# Patient Record
Sex: Male | Born: 1985 | Race: White | Hispanic: No | Marital: Married | Smoking: Former smoker
Health system: Southern US, Community
[De-identification: ages and names within clinical notes are randomized; demographics above are authoritative.]

## PROBLEM LIST (undated history)

## (undated) DIAGNOSIS — T7840XA Allergy, unspecified, initial encounter: Secondary | ICD-10-CM

## (undated) DIAGNOSIS — I10 Essential (primary) hypertension: Secondary | ICD-10-CM

## (undated) DIAGNOSIS — N200 Calculus of kidney: Secondary | ICD-10-CM

## (undated) HISTORY — DX: Allergy, unspecified, initial encounter: T78.40XA

## (undated) HISTORY — DX: Calculus of kidney: N20.0

## (undated) HISTORY — DX: Essential (primary) hypertension: I10

## (undated) HISTORY — PX: NO PAST SURGERIES: SHX2092

---

## 2017-02-27 ENCOUNTER — Ambulatory Visit (INDEPENDENT_AMBULATORY_CARE_PROVIDER_SITE_OTHER): Payer: PRIVATE HEALTH INSURANCE

## 2017-02-27 ENCOUNTER — Ambulatory Visit (INDEPENDENT_AMBULATORY_CARE_PROVIDER_SITE_OTHER): Payer: PRIVATE HEALTH INSURANCE | Admitting: Physician Assistant

## 2017-02-27 ENCOUNTER — Encounter: Payer: Self-pay | Admitting: Physician Assistant

## 2017-02-27 VITALS — BP 123/88 | HR 53 | Ht 68.0 in | Wt 191.0 lb

## 2017-02-27 DIAGNOSIS — R109 Unspecified abdominal pain: Secondary | ICD-10-CM

## 2017-02-27 DIAGNOSIS — N200 Calculus of kidney: Secondary | ICD-10-CM

## 2017-02-27 DIAGNOSIS — G8929 Other chronic pain: Secondary | ICD-10-CM | POA: Diagnosis not present

## 2017-02-27 DIAGNOSIS — Z87442 Personal history of urinary calculi: Secondary | ICD-10-CM | POA: Diagnosis not present

## 2017-02-27 DIAGNOSIS — Z7689 Persons encountering health services in other specified circumstances: Secondary | ICD-10-CM | POA: Diagnosis not present

## 2017-02-27 LAB — COMPREHENSIVE METABOLIC PANEL
ALT: 26 U/L (ref 9–46)
AST: 20 U/L (ref 10–40)
Albumin: 4.4 g/dL (ref 3.6–5.1)
Alkaline Phosphatase: 50 U/L (ref 40–115)
BUN: 13 mg/dL (ref 7–25)
CHLORIDE: 103 mmol/L (ref 98–110)
CO2: 22 mmol/L (ref 20–31)
CREATININE: 1.11 mg/dL (ref 0.60–1.35)
Calcium: 9.2 mg/dL (ref 8.6–10.3)
GLUCOSE: 82 mg/dL (ref 65–99)
POTASSIUM: 4.6 mmol/L (ref 3.5–5.3)
SODIUM: 138 mmol/L (ref 135–146)
TOTAL PROTEIN: 6.8 g/dL (ref 6.1–8.1)
Total Bilirubin: 0.4 mg/dL (ref 0.2–1.2)

## 2017-02-27 LAB — POCT URINALYSIS DIPSTICK
Bilirubin, UA: NEGATIVE
GLUCOSE UA: NEGATIVE
Ketones, UA: NEGATIVE
Leukocytes, UA: NEGATIVE
NITRITE UA: NEGATIVE
PH UA: 5.5 (ref 5.0–8.0)
Protein, UA: NEGATIVE
UROBILINOGEN UA: 0.2 U/dL

## 2017-02-27 LAB — CBC
HCT: 45.2 % (ref 38.5–50.0)
Hemoglobin: 15.1 g/dL (ref 13.2–17.1)
MCH: 29.5 pg (ref 27.0–33.0)
MCHC: 33.4 g/dL (ref 32.0–36.0)
MCV: 88.3 fL (ref 80.0–100.0)
MPV: 10.1 fL (ref 7.5–12.5)
PLATELETS: 273 10*3/uL (ref 140–400)
RBC: 5.12 MIL/uL (ref 4.20–5.80)
RDW: 13.1 % (ref 11.0–15.0)
WBC: 5.2 10*3/uL (ref 3.8–10.8)

## 2017-02-27 LAB — LIPID PANEL W/REFLEX DIRECT LDL
Cholesterol: 209 mg/dL — ABNORMAL HIGH (ref <200)
HDL: 53 mg/dL (ref >40)
LDL-CHOLESTEROL: 138 mg/dL — AB
NON-HDL CHOLESTEROL (CALC): 156 mg/dL — AB (ref <130)
Total CHOL/HDL Ratio: 3.9 Ratio (ref <5.0)
Triglycerides: 83 mg/dL (ref <150)

## 2017-02-27 NOTE — Progress Notes (Signed)
HPI:                                                                Mitchell Kemp is a 31 y.o. male who presents to [facility]: Primary Care Sports Medicine today to establish care   Current Concerns include kidney stones  Patient reports intermittent right-sided sharp flank pain for approx. 1 year. States he was told at an Emergency Room in [place] that he had a right-sided kidney stone approx. 1 year ago. States he was told he had a 6mm size stone. He was discharged on Flomax and states he only took it for a day or two. Denies hematuria, frequency, urgency, abdominal pain.  Health Maintenance Health Maintenance  Topic Date Due  . HIV Screening  04/28/1999  . TETANUS/TDAP  04/28/2003  . INFLUENZA VACCINE  06/04/2017    Past Medical History:  Diagnosis Date  . Allergy   . Hypertension   . Nephrolithiasis    Past Surgical History:  Procedure Laterality Date  . NO PAST SURGERIES     Social History  Substance Use Topics  . Smoking status: Former Smoker    Packs/day: 1.00    Years: 4.00    Types: Cigarettes    Quit date: 2007  . Smokeless tobacco: Current User    Types: Chew  . Alcohol use 0.6 - 1.2 oz/week    1 - 2 Cans of beer per week   family history is not on file.  ROS: negative except as noted in the HPI  Medications: No current outpatient prescriptions on file.   No current facility-administered medications for this visit.    No Known Allergies     Objective:  BP 123/88   Pulse (!) 53   Ht  (1.727 m)   Wt 191 lb (86.6 kg)   BMI 29.04 kg/m  Gen: well-groomed, cooperative, not ill-appearing, no distress Pulm: Normal work of breathing, normal phonation, clear to auscultation bilaterally CV: Normal rate, regular rhythm, s1 and s2 distinct, no murmurs, clicks or rubs, no carotid bruit GI: abdomen soft, nondistended, nontender, no masses, no CVA tenderness Neuro: alert and oriented x 3, EOM's intact, normal tone, no  tremor MSK: moving all extremities, normal gait and station, no peripheral edema Skin: warm and dry, no rashes or lesions on exposed skin Psych: normal affect, euthymic mood, normal speech and thought content  Depression screen [facility] 2/9 02/27/2017  Decreased Interest 0  Down, Depressed, Hopeless 0  PHQ - 2 Score 0     Results for orders placed or performed in visit on 02/27/17 (from the past 72 hour(s))  POCT Urinalysis Dipstick     Status: Abnormal   Collection Time: 02/27/17  9:48 AM  Result Value Ref Range   Color, UA light yellow    Clarity, UA clear    Glucose, UA negative    Bilirubin, UA negative    Ketones, UA negative    Spec Grav, UA <=1.005 (A) 1.010 - 1.025   Blood, UA trace    pH, UA 5.5 5.0 - 8.0   Protein, UA negative    Urobilinogen, UA 0.2 0.2 or 1.0 E.U./dL   Nitrite, UA negative    Leukocytes, UA Negative Negative  CBC  Status: None   Collection Time: 02/27/17  9:59 AM  Result Value Ref Range   WBC 5.2 3.8 - 10.8 K/uL   RBC 5.12 4.20 - 5.80 MIL/uL   Hemoglobin 15.1 13.2 - 17.1 g/dL   HCT 16.1 09.6 - 04.5 %   MCV 88.3 80.0 - 100.0 fL   MCH 29.5 27.0 - 33.0 pg   MCHC 33.4 32.0 - 36.0 g/dL   RDW 40.9 81.1 - 91.4 %   Platelets 273 140 - 400 K/uL   MPV 10.1 7.5 - 12.5 fL  Comprehensive metabolic panel     Status: None (Preliminary result)   Collection Time: 02/27/17  9:59 AM  Result Value Ref Range   Sodium  135 - 146 mmol/L   Potassium  3.5 - 5.3 mmol/L   Chloride  98 - 110 mmol/L   CO2  20 - 31 mmol/L   Glucose, Bld  65 - 99 mg/dL   BUN  7 - 25 mg/dL   Creat  7.82 - 9.56 mg/dL   Total Bilirubin  0.2 - 1.2 mg/dL   Alkaline Phosphatase  40 - 115 U/L   AST  10 - 40 U/L   ALT  9 - 46 U/L   Total Protein  6.1 - 8.1 g/dL   Albumin  3.6 - 5.1 g/dL   Calcium  8.6 - 21.3 mg/dL  Lipid Panel w/reflex Direct LDL     Status: None (Preliminary result)   Collection Time: 02/27/17  9:59 AM  Result Value Ref Range   Cholesterol  <200 mg/dL   Triglycerides   <086 mg/dL   HDL  mg/dL   Total CHOL/HDL Ratio  <5.0 Ratio   Non-HDL Cholesterol (Calc)  <130 mg/dL   LDL-Cholesterol  mg/dL   Dg Abd 1 View  Result Date: 02/27/2017 CLINICAL DATA:  Chronic right flank pain. EXAM: ABDOMEN - 1 VIEW COMPARISON:  No recent prior . FINDINGS: Soft tissue structures are unremarkable. No bowel distention. No free air. 8 mm calcific density noted over the right lower pelvis. Distal right ureteral stone cannot be excluded. No acute bony abnormality. IMPRESSION: 8 mm calcific density noted over the right lower renal pelvis. A distal right ureteral stone cannot be excluded. Electronically Signed   By: Maisie Fus  Register   On: 02/27/2017 10:48      Assessment and Plan: 31 y.o. male with  1. Encounter to establish care - CBC - Comprehensive metabolic panel - Lipid Panel w/reflex Direct LDL  2. Right flank pain, chronic - POCT Urinalysis Dipstick shows trace blood - DG Abd 1 View to assess for stone - I do not have access to patient's records from [place], but if he does have a 6mm stone present for 1 year it is unlikely to pass spontaneously and will need to refer him to Urology   Patient education and anticipatory guidance given Patient agrees with treatment plan Follow-up in 1 year for CPE or sooner as needed  Levonne Hubert PA-C

## 2017-02-27 NOTE — Patient Instructions (Signed)
I am going to refer you to Urology for follow-up of your flank pain/history of kidney stones  Go downstairs for X-ray and labs today    Dietary Guidelines to Help Prevent Kidney Stones Kidney stones are deposits of minerals and salts that form inside your kidneys. Your risk of developing kidney stones may be greater depending on your diet, your lifestyle, the medicines you take, and whether you have certain medical conditions. Most people can reduce their chances of developing kidney stones by following the instructions below. Depending on your overall health and the type of kidney stones you tend to develop, your dietitian may give you more specific instructions. What are tips for following this plan? Reading food labels   Choose foods with "no salt added" or "low-salt" labels. Limit your sodium intake to less than 1500 mg per day.  Choose foods with calcium for each meal and snack. Try to eat about 300 mg of calcium at each meal. Foods that contain 200-500 mg of calcium per serving include:  8 oz (237 ml) of milk, fortified nondairy milk, and fortified fruit juice.  8 oz (237 ml) of kefir, yogurt, and soy yogurt.  4 oz (118 ml) of tofu.  1 oz of cheese.  1 cup (300 g) of dried figs.  1 cup (91 g) of cooked broccoli.  1-3 oz can of sardines or mackerel.  Most people need 1000 to 1500 mg of calcium each day. Talk to your dietitian about how much calcium is recommended for you. Shopping   Buy plenty of fresh fruits and vegetables. Most people do not need to avoid fruits and vegetables, even if they contain nutrients that may contribute to kidney stones.  When shopping for convenience foods, choose:  Whole pieces of fruit.  Premade salads with dressing on the side.  Low-fat fruit and yogurt smoothies.  Avoid buying frozen meals or prepared deli foods.  Look for foods with live cultures, such as yogurt and kefir. Cooking   Do not add salt to food when cooking. Place a salt  shaker on the table and allow each person to add his or her own salt to taste.  Use vegetable protein, such as beans, textured vegetable protein (TVP), or tofu instead of meat in pasta, casseroles, and soups. Meal planning   Eat less salt, if told by your dietitian. To do this:  Avoid eating processed or premade food.  Avoid eating fast food.  Eat less animal protein, including cheese, meat, poultry, or fish, if told by your dietitian. To do this:  Limit the number of times you have meat, poultry, fish, or cheese each week. Eat a diet free of meat at least 2 days a week.  Eat only one serving each day of meat, poultry, fish, or seafood.  When you prepare animal protein, cut pieces into small portion sizes. For most meat and fish, one serving is about the size of one deck of cards.  Eat at least 5 servings of fresh fruits and vegetables each day. To do this:  Keep fruits and vegetables on hand for snacks.  Eat 1 piece of fruit or a handful of berries with breakfast.  Have a salad and fruit at lunch.  Have two kinds of vegetables at dinner.  Limit foods that are high in a substance called oxalate. These include:  Spinach.  Rhubarb.  Beets.  Potato chips and french fries.  Nuts.  If you regularly take a diuretic medicine, make sure to eat at least  1-2 fruits or vegetables high in potassium each day. These include:  Avocado.  Banana.  Orange, prune, carrot, or tomato juice.  Baked potato.  Cabbage.  Beans and split peas. General instructions   Drink enough fluid to keep your urine clear or pale yellow. This is the most important thing you can do.  Talk to your health care provider and dietitian about taking daily supplements. Depending on your health and the cause of your kidney stones, you may be advised:  Not to take supplements with vitamin C.  To take a calcium supplement.  To take a daily probiotic supplement.  To take other supplements such as  magnesium, fish oil, or vitamin B6.  Take all medicines and supplements as told by your health care provider.  Limit alcohol intake to no more than 1 drink a day for nonpregnant women and 2 drinks a day for men. One drink equals 12 oz of beer, 5 oz of wine, or 1 oz of hard liquor.  Lose weight if told by your health care provider. Work with your dietitian to find strategies and an eating plan that works best for you. What foods are not recommended? Limit your intake of the following foods, or as told by your dietitian. Talk to your dietitian about specific foods you should avoid based on the type of kidney stones and your overall health. Grains  Breads. Bagels. Rolls. Baked goods. Salted crackers. Cereal. Pasta. Vegetables  Spinach. Rhubarb. Beets. Canned vegetables. Rosita Fire. Olives. Meats and other protein foods  Nuts. Nut butters. Large portions of meat, poultry, or fish. Salted or cured meats. Deli meats. Hot dogs. Sausages. Dairy  Cheese. Beverages  Regular soft drinks. Regular vegetable juice. Seasonings and other foods  Seasoning blends with salt. Salad dressings. Canned soups. Soy sauce. Ketchup. Barbecue sauce. Canned pasta sauce. Casseroles. Pizza. Lasagna. Frozen meals. Potato chips. [place] fries. Summary  You can reduce your risk of kidney stones by making changes to your diet.  The most important thing you can do is drink enough fluid. You should drink enough fluid to keep your urine clear or pale yellow.  Ask your health care provider or dietitian how much protein from animal sources you should eat each day, and also how much salt and calcium you should have each day. This information is not intended to replace advice given to you by your health care provider. Make sure you discuss any questions you have with your health care provider. Document Released: 02/15/2011 Document Revised: 10/01/2016 Document Reviewed: 10/01/2016 Elsevier Interactive Patient Education  2017  [employer].

## 2018-06-09 IMAGING — DX DG ABDOMEN 1V
2 series · 2 of 2 positions shown · non-contrast
Comparison: No recent prior .

CLINICAL DATA: Chronic right flank pain.

EXAM:
ABDOMEN - 1 VIEW

[abdomen kub (1 of 2)]
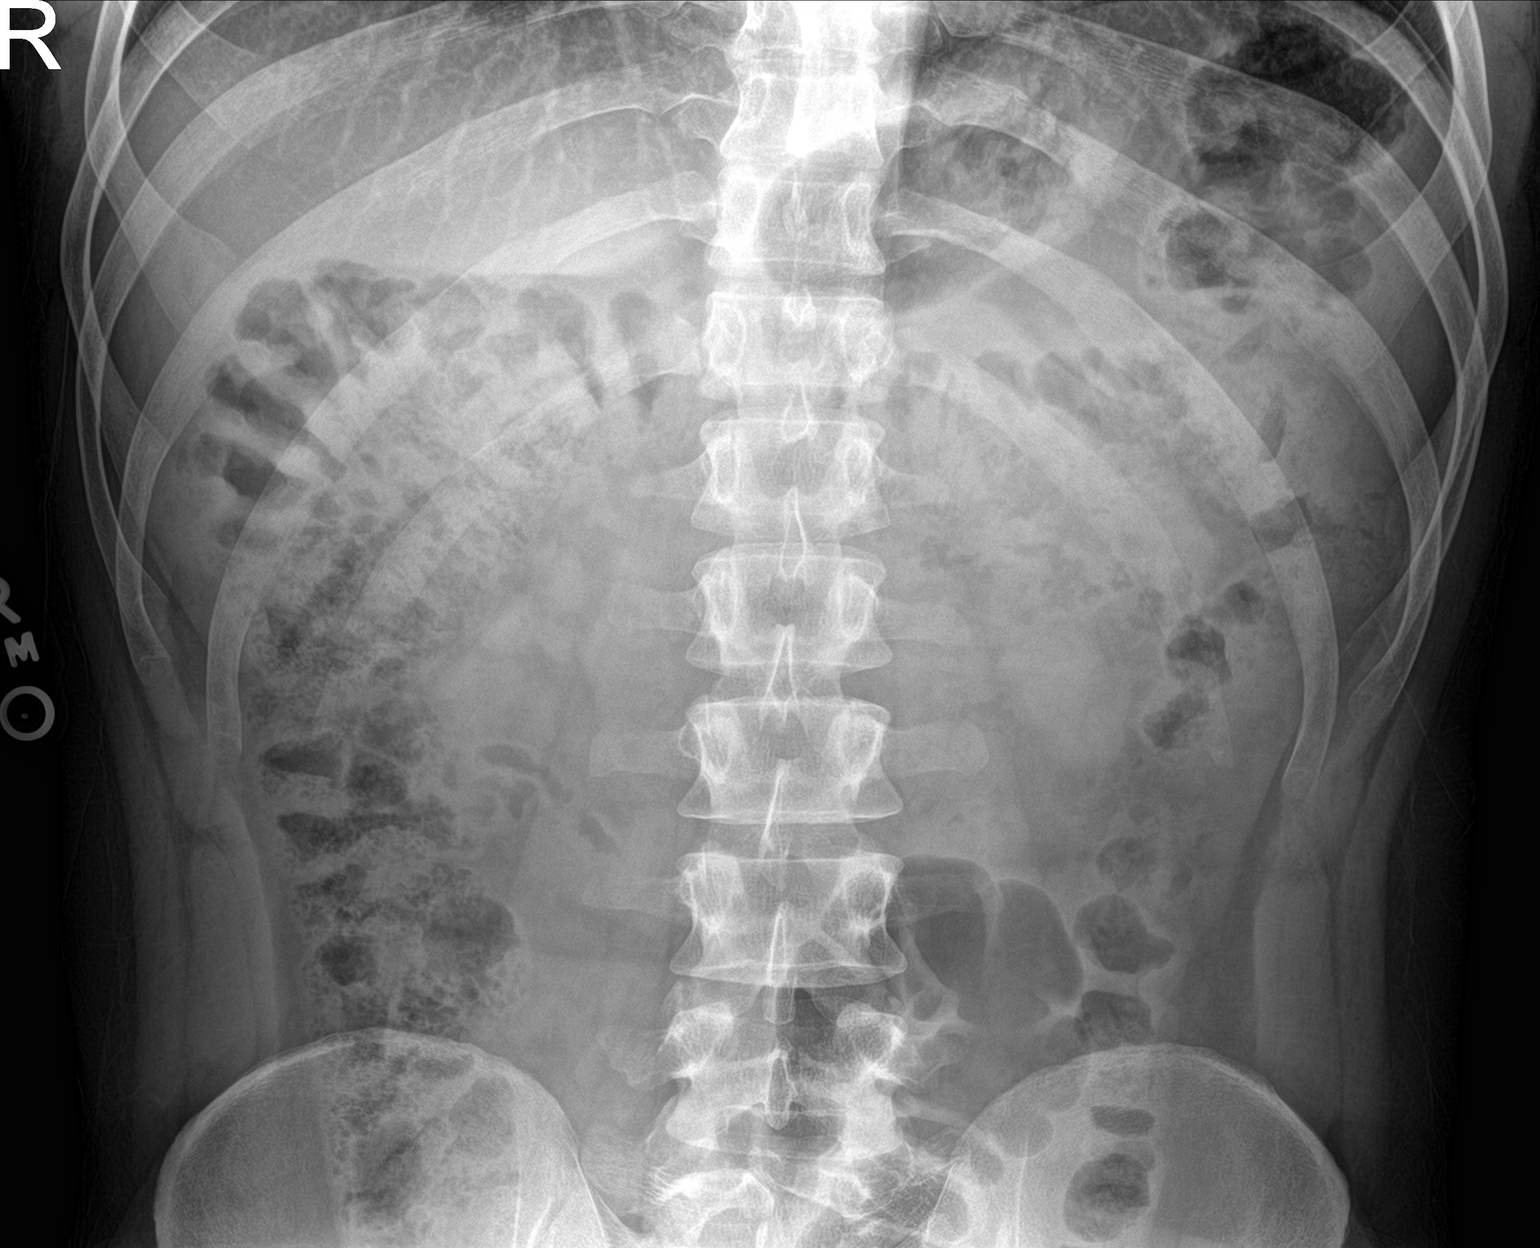

[abdomen kub (2 of 2)]
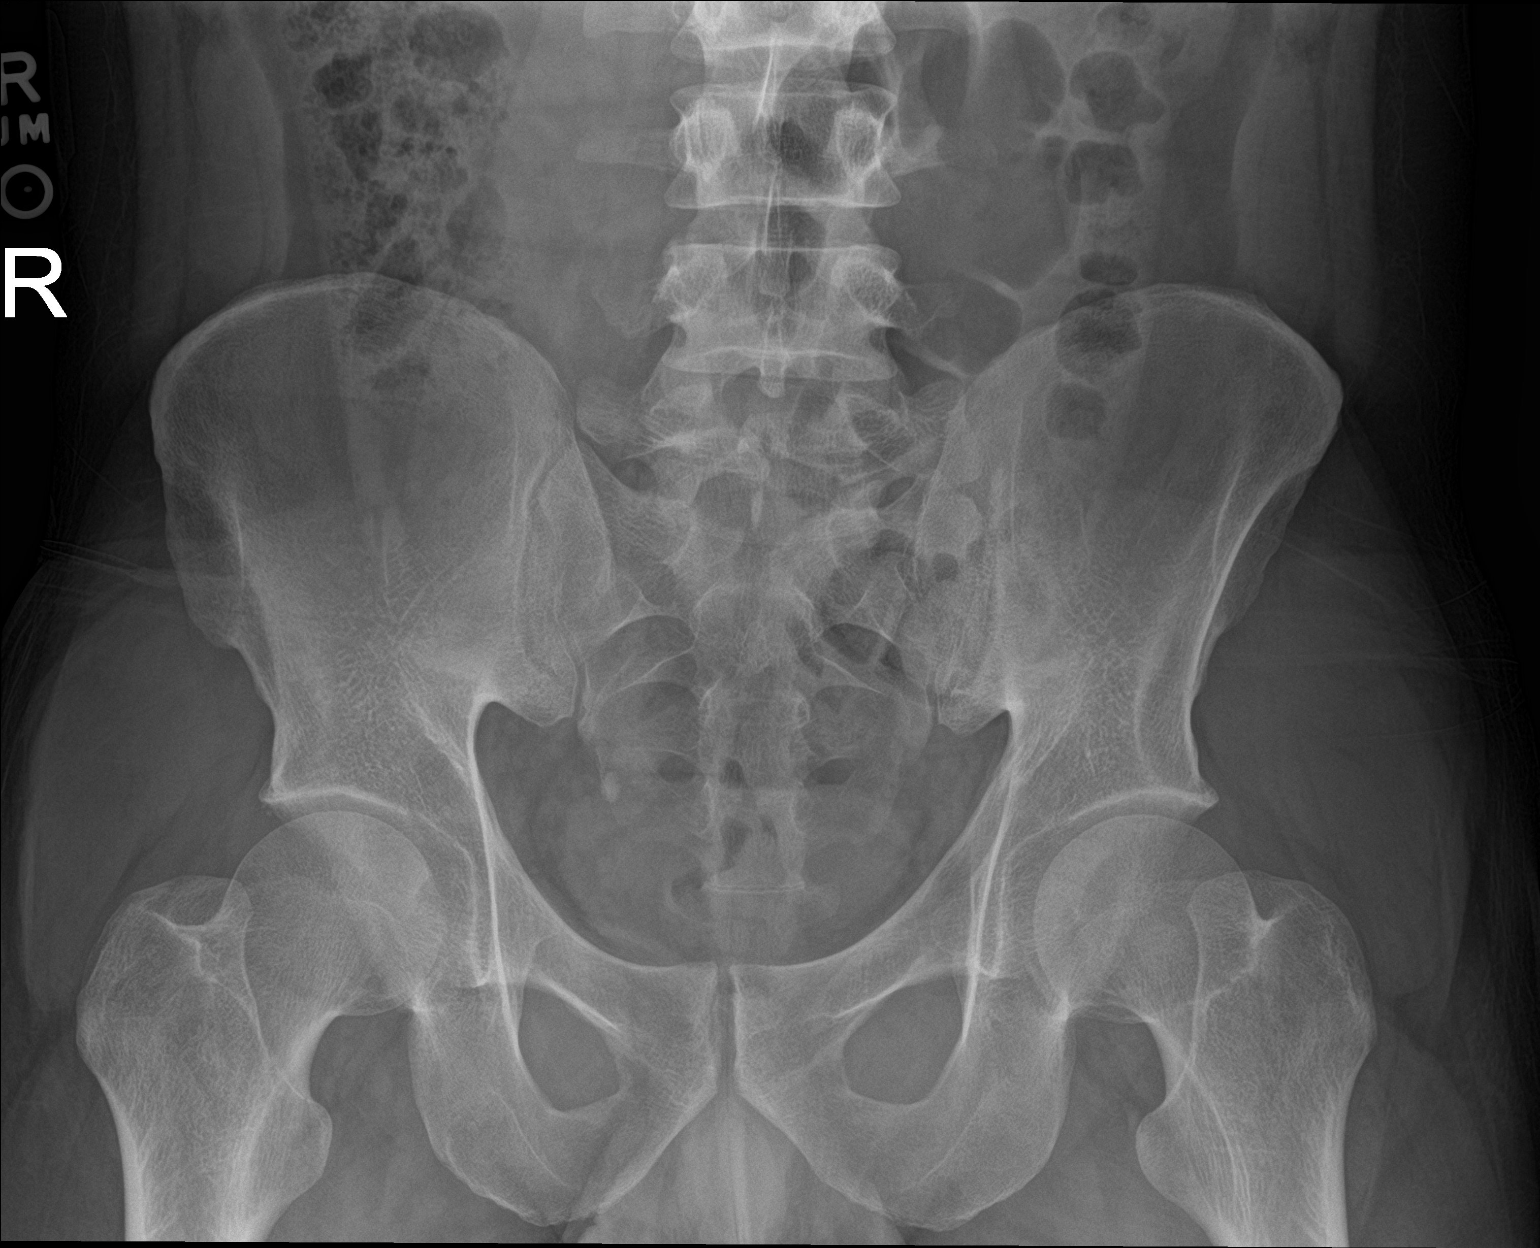

[2 of 2 positions shown; findings below may reference images not displayed]

FINDINGS: Soft tissue structures are unremarkable. No bowel distention. No
free air. 8 mm calcific density noted over the right lower pelvis.
Distal right ureteral stone cannot be excluded. No acute bony
abnormality.
IMPRESSION: 8 mm calcific density noted over the right lower renal pelvis. A
distal right ureteral stone cannot be excluded.
